# Patient Record
Sex: Female | Born: 1970 | Race: Black or African American | Hispanic: No | Marital: Single | Smoking: Current every day smoker
Health system: Southern US, Community
[De-identification: ages and names within clinical notes are randomized; demographics above are authoritative.]

## PROBLEM LIST (undated history)

## (undated) ENCOUNTER — Ambulatory Visit (HOSPITAL_COMMUNITY): Discharge status: Absent without leave | Payer: 59

## (undated) HISTORY — PX: ECTOPIC PREGNANCY SURGERY: SHX613

---

## 2007-04-12 ENCOUNTER — Ambulatory Visit (HOSPITAL_COMMUNITY): Admission: RE | Admit: 2007-04-12 | Discharge: 2007-04-12 | Payer: Self-pay | Admitting: Family Medicine

## 2007-11-06 ENCOUNTER — Encounter: Admission: RE | Admit: 2007-11-06 | Discharge: 2007-11-06 | Payer: Self-pay | Admitting: Family Medicine

## 2008-11-13 ENCOUNTER — Emergency Department (HOSPITAL_COMMUNITY): Admission: EM | Admit: 2008-11-13 | Discharge: 2008-11-13 | Payer: Self-pay | Admitting: Emergency Medicine

## 2010-11-26 ENCOUNTER — Encounter: Payer: Self-pay | Admitting: Family Medicine

## 2010-11-27 ENCOUNTER — Encounter: Payer: Self-pay | Admitting: Family Medicine

## 2012-07-29 ENCOUNTER — Emergency Department (HOSPITAL_COMMUNITY)
Admission: EM | Admit: 2012-07-29 | Discharge: 2012-07-29 | Disposition: A | Discharge status: Home / Self Care | Payer: 59 | Attending: Emergency Medicine | Admitting: Emergency Medicine

## 2012-07-29 ENCOUNTER — Encounter (HOSPITAL_COMMUNITY): Payer: Self-pay | Admitting: *Deleted

## 2012-07-29 DIAGNOSIS — R21 Rash and other nonspecific skin eruption: Secondary | ICD-10-CM | POA: Insufficient documentation

## 2012-07-29 DIAGNOSIS — F17200 Nicotine dependence, unspecified, uncomplicated: Secondary | ICD-10-CM | POA: Insufficient documentation

## 2012-07-29 MED ORDER — PERMETHRIN 5 % EX CREA: TOPICAL_CREAM | CUTANEOUS | Status: DC

## 2012-07-29 NOTE — ED Notes (Addendum)
Rash for years, gets better , then comes back, pt says atopic dermatitis.    Itches and burns

## 2012-08-01 NOTE — ED Provider Notes (Signed)
History     CSN: [id number]  Arrival date & time 07/29/12  1127   First MD Initiated Contact with Patient 07/29/12 1226      Chief Complaint  Patient presents with  . Rash    (Consider location/radiation/quality/duration/timing/severity/associated sxs/prior treatment) HPI Comments: Tammy Valdez presents with a rash which has been intermittent for the past several years. The rash is itchy and has been seen by a dermatologist and had complete allergy testing which was inconclusive.  She has had multiple treatments with steroid creams which sometimes alleviates the rash and itching.  However, the past several months she has developed a papular rash on her abdomen,  Neck and groin which has not responded to steroid creams.  She reports several coworkers have a similar rash and one was treated for scabies and hers improved.  She is desirous of scabies treatment today.  She denies fevers and chills,  Has used benadryl which sometimes relieves the itch.    The history is provided by the patient.    History reviewed. No pertinent past medical history.  Past Surgical History  Procedure Date  . Ectopic pregnancy surgery     History reviewed. No pertinent family history.  History  Substance Use Topics  . Smoking status: Current Every Day Smoker  . Smokeless tobacco: Not on file  . Alcohol Use: No    OB History    Grav Para Term Preterm Abortions TAB SAB Ect Mult Living                  Review of Systems  Constitutional: Negative for fever and chills.  HENT: Negative for facial swelling.   Respiratory: Negative for shortness of breath and wheezing.   Skin: Positive for rash.  Neurological: Negative for numbness.    Allergies  Review of patient's allergies indicates no known allergies.  Home Medications   Current Outpatient Rx  Name Route Sig Dispense Refill  . PERMETHRIN 5 % EX CREA  Apply from chin down and leave on for 12 hours,  Then shower 60 g 1    BP 127/84   Pulse 74  Temp 98.2 F (36.8 C) (Oral)  Resp 16  SpO2 100%  LMP 07/23/2012  Physical Exam  Constitutional: She appears well-developed and well-nourished. No distress.  HENT:  Head: Normocephalic.  Neck: Neck supple.  Cardiovascular: Normal rate.   Pulmonary/Chest: Effort normal. She has no wheezes.  Musculoskeletal: Normal range of motion. She exhibits no edema.  Skin: Rash noted. Rash is papular.       Scattered papules and excoriations on abdomen and volar forearms.  No drainage, vesicles,  Papules are blanching.  She has a more plaquelike, hyperkeratotic lesion on her right neck more consistent with probable eczematous rash.      ED Course  Procedures (including critical care time)  Labs Reviewed - No data to display No results found.   1. Rash       MDM  Acute on chronic rash with recent exposure to scabies.  Pt was prescribed permethrin with 1 refill.  Instructions given.  Encouraged to f/u with Dr. Margo Aye of dermatology (has seen in past) if sx not improved.        Burgess Amor, PA 08/01/12 2240

## 2012-08-02 NOTE — ED Provider Notes (Signed)
Medical screening examination/treatment/procedure(s) were performed by non-physician practitioner and as supervising physician I was immediately available for consultation/collaboration.   Tatayana Beshears L Excell Neyland, MD 08/02/12 1452 

## 2016-04-04 ENCOUNTER — Other Ambulatory Visit (HOSPITAL_COMMUNITY)
Admission: RE | Admit: 2016-04-04 | Discharge: 2016-04-04 | Disposition: A | Discharge status: Home / Self Care | Payer: BLUE CROSS/BLUE SHIELD | Source: Ambulatory Visit | Attending: Nurse Practitioner | Admitting: Nurse Practitioner

## 2016-04-04 ENCOUNTER — Other Ambulatory Visit: Payer: Self-pay | Admitting: Nurse Practitioner

## 2016-04-04 DIAGNOSIS — Z1151 Encounter for screening for human papillomavirus (HPV): Secondary | ICD-10-CM | POA: Insufficient documentation

## 2016-04-04 DIAGNOSIS — Z01419 Encounter for gynecological examination (general) (routine) without abnormal findings: Secondary | ICD-10-CM | POA: Diagnosis present

## 2016-04-06 LAB — CYTOLOGY - PAP

## 2017-03-20 ENCOUNTER — Ambulatory Visit (HOSPITAL_COMMUNITY)
Admission: EM | Admit: 2017-03-20 | Discharge: 2017-03-20 | Disposition: A | Discharge status: Home / Self Care | Payer: 59 | Attending: Internal Medicine | Admitting: Internal Medicine

## 2017-03-20 ENCOUNTER — Encounter (HOSPITAL_COMMUNITY): Payer: Self-pay | Admitting: Emergency Medicine

## 2017-03-20 DIAGNOSIS — G43909 Migraine, unspecified, not intractable, without status migrainosus: Secondary | ICD-10-CM

## 2017-03-20 MED ORDER — DEXAMETHASONE SODIUM PHOSPHATE 10 MG/ML IJ SOLN
INTRAMUSCULAR | Status: AC
  Filled 2017-03-20: qty 1

## 2017-03-20 MED ORDER — ONDANSETRON 4 MG PO TBDP
4.0000 mg | ORAL_TABLET | Freq: Once | ORAL | Status: AC
  Administered 2017-03-20: 4 mg via ORAL

## 2017-03-20 MED ORDER — ONDANSETRON 4 MG PO TBDP: 4.0000 mg | ORAL_TABLET | Freq: Three times a day (TID) | ORAL | 0 refills | Status: AC | PRN

## 2017-03-20 MED ORDER — DEXAMETHASONE SODIUM PHOSPHATE 10 MG/ML IJ SOLN
10.0000 mg | Freq: Once | INTRAMUSCULAR | Status: AC
  Administered 2017-03-20: 10 mg via INTRAMUSCULAR

## 2017-03-20 MED ORDER — KETOROLAC TROMETHAMINE 10 MG PO TABS: 10.0000 mg | ORAL_TABLET | Freq: Four times a day (QID) | ORAL | 0 refills | Status: AC | PRN

## 2017-03-20 MED ORDER — KETOROLAC TROMETHAMINE 30 MG/ML IJ SOLN
30.0000 mg | Freq: Once | INTRAMUSCULAR | Status: AC
  Administered 2017-03-20: 30 mg via INTRAMUSCULAR

## 2017-03-20 MED ORDER — ONDANSETRON 4 MG PO TBDP
ORAL_TABLET | ORAL | Status: AC
  Filled 2017-03-20: qty 1

## 2017-03-20 MED ORDER — KETOROLAC TROMETHAMINE 30 MG/ML IJ SOLN
INTRAMUSCULAR | Status: AC
  Filled 2017-03-20: qty 1

## 2017-03-20 NOTE — Discharge Instructions (Signed)
For your headache, prescribed Toradol, and Zofran, uses drugs as directed. I recommend he follow-up with community health and wellness to establish for primary care, as if her headaches continue, or worsen, you may need further testing and evaluation.

## 2017-03-20 NOTE — ED Provider Notes (Signed)
CSN: [id number]     Arrival date & time 03/20/17  1815 History   First MD Initiated Contact with Patient 03/20/17 1844     Chief Complaint  Patient presents with  . Facial Pain   (Consider location/radiation/quality/duration/timing/severity/associated sxs/prior Treatment) 46 year old female presents to clinic with three-day history of headache. States she has light sensitivity, sound sensitivity, nausea, headache is described as dull, aching, and global over her entire head. She does have a history of headaches, she does not have or rales, has no vision loss, or change in vision acuity. She denies any unilateral weakness, difficulty with speech, or other symptoms.   The history is provided by the patient.    History reviewed. No pertinent past medical history. Past Surgical History:  Procedure Laterality Date  . ECTOPIC PREGNANCY SURGERY     History reviewed. No pertinent family history. Social History  Substance Use Topics  . Smoking status: Current Every Day Smoker  . Smokeless tobacco: Not on file  . Alcohol use No   OB History    No data available     Review of Systems  Constitutional: Negative.   HENT: Negative.   Eyes: Positive for photophobia.  Respiratory: Negative.   Cardiovascular: Negative.   Gastrointestinal: Negative.   Musculoskeletal: Negative.   Skin: Negative.   Neurological: Positive for headaches. Negative for dizziness, facial asymmetry and numbness.    Allergies  Patient has no known allergies.  Home Medications   Prior to Admission medications   Medication Sig Start Date End Date Taking? Authorizing Provider  ketorolac (TORADOL) 10 MG tablet Take 1 tablet (10 mg total) by mouth every 6 (six) hours as needed. 03/20/17   Dorena BodoKennard, Alecxis Baltzell, NP  ondansetron (ZOFRAN ODT) 4 MG disintegrating tablet Take 1 tablet (4 mg total) by mouth every 8 (eight) hours as needed for nausea or vomiting. 03/20/17   Dorena BodoKennard, Jaskiran Pata, NP   Meds Ordered and  Administered this Visit   Medications  ketorolac (TORADOL) 30 MG/ML injection 30 mg (30 mg Intramuscular Given 03/20/17 1910)  dexamethasone (DECADRON) injection 10 mg (10 mg Intramuscular Given 03/20/17 1909)  ondansetron (ZOFRAN-ODT) disintegrating tablet 4 mg (4 mg Oral Given 03/20/17 1908)    BP (!) 155/82 (BP Location: Right Arm)   Pulse 81   Temp 98.5 F (36.9 C) (Oral)   Resp 18   LMP 07/23/2012   SpO2 100%  No data found.   Physical Exam  Constitutional: She is oriented to person, place, and time. She appears well-developed and well-nourished. No distress.  HENT:  Head: Normocephalic and atraumatic.  Right Ear: External ear normal.  Left Ear: External ear normal.  Nose: Nose normal.  Mouth/Throat: Oropharynx is clear and moist.  Eyes: Conjunctivae and EOM are normal. Pupils are equal, round, and reactive to light. Right eye exhibits no discharge. Left eye exhibits no discharge.  Neck: Normal range of motion. Neck supple.  Cardiovascular: Normal rate and regular rhythm.   Pulmonary/Chest: Effort normal and breath sounds normal.  Abdominal: Soft. Bowel sounds are normal.  Lymphadenopathy:    She has no cervical adenopathy.  Neurological: She is alert and oriented to person, place, and time. No cranial nerve deficit. She exhibits normal muscle tone. Coordination normal.  Skin: Skin is warm and dry. Capillary refill takes less than 2 seconds. She is not diaphoretic.  Psychiatric: She has a normal mood and affect. Her behavior is normal.  Nursing note and vitals reviewed.   Urgent Care Course     Procedures (  including critical care time)  Labs Review Labs Reviewed - No data to display  Imaging Review No results found.      MDM   1. Migraine without status migrainosus, not intractable, unspecified migraine type    Treating for migraine, patient given Toradol, and Decadron in clinic. Along with Zofran. Discharged home with a work note, prescription for Zofran,  and for Toradol as well. Provided contact information for community health and wellness, encouraged her to follow up to establish for primary care, follow-up guidelines were given should her headache worsen, go to the ER.    Dorena Bodo, NP 03/20/17 2047

## 2017-03-20 NOTE — ED Triage Notes (Signed)
[facility] a [facility] of [facility] headache and sinus pain and pressure x 2 days.

## 2021-05-14 DIAGNOSIS — F17200 Nicotine dependence, unspecified, uncomplicated: Secondary | ICD-10-CM | POA: Diagnosis not present

## 2021-05-14 DIAGNOSIS — J0100 Acute maxillary sinusitis, unspecified: Secondary | ICD-10-CM | POA: Diagnosis not present

## 2021-05-14 DIAGNOSIS — I10 Essential (primary) hypertension: Secondary | ICD-10-CM | POA: Diagnosis not present

## 2021-06-03 ENCOUNTER — Other Ambulatory Visit: Payer: Self-pay | Admitting: Nurse Practitioner

## 2021-06-03 DIAGNOSIS — S30814A Abrasion of vagina and vulva, initial encounter: Secondary | ICD-10-CM | POA: Diagnosis not present

## 2021-06-03 DIAGNOSIS — N6489 Other specified disorders of breast: Secondary | ICD-10-CM

## 2021-06-03 DIAGNOSIS — Z01419 Encounter for gynecological examination (general) (routine) without abnormal findings: Secondary | ICD-10-CM | POA: Diagnosis not present

## 2021-06-03 DIAGNOSIS — I10 Essential (primary) hypertension: Secondary | ICD-10-CM | POA: Diagnosis not present

## 2021-06-03 DIAGNOSIS — L292 Pruritus vulvae: Secondary | ICD-10-CM | POA: Diagnosis not present

## 2021-06-03 DIAGNOSIS — Z113 Encounter for screening for infections with a predominantly sexual mode of transmission: Secondary | ICD-10-CM | POA: Diagnosis not present

## 2021-06-11 DIAGNOSIS — Z20822 Contact with and (suspected) exposure to covid-19: Secondary | ICD-10-CM | POA: Diagnosis not present

## 2021-07-06 DIAGNOSIS — I10 Essential (primary) hypertension: Secondary | ICD-10-CM | POA: Diagnosis not present

## 2021-07-06 DIAGNOSIS — Z23 Encounter for immunization: Secondary | ICD-10-CM | POA: Diagnosis not present

## 2021-07-06 DIAGNOSIS — Z0000 Encounter for general adult medical examination without abnormal findings: Secondary | ICD-10-CM | POA: Diagnosis not present

## 2021-07-06 DIAGNOSIS — F17200 Nicotine dependence, unspecified, uncomplicated: Secondary | ICD-10-CM | POA: Diagnosis not present

## 2021-07-06 DIAGNOSIS — Z13220 Encounter for screening for lipoid disorders: Secondary | ICD-10-CM | POA: Diagnosis not present

## 2021-07-07 ENCOUNTER — Ambulatory Visit
Admission: RE | Admit: 2021-07-07 | Discharge: 2021-07-07 | Disposition: A | Discharge status: Home / Self Care | Payer: BC Managed Care – PPO | Source: Ambulatory Visit | Attending: Nurse Practitioner | Admitting: Nurse Practitioner

## 2021-07-07 ENCOUNTER — Ambulatory Visit: Admission: RE | Admit: 2021-07-07 | Payer: 59 | Source: Ambulatory Visit

## 2021-07-07 ENCOUNTER — Ambulatory Visit: Payer: 59

## 2021-07-07 ENCOUNTER — Other Ambulatory Visit: Payer: Self-pay

## 2021-07-07 DIAGNOSIS — N6489 Other specified disorders of breast: Secondary | ICD-10-CM

## 2021-07-07 DIAGNOSIS — R922 Inconclusive mammogram: Secondary | ICD-10-CM | POA: Diagnosis not present

## 2021-10-03 DIAGNOSIS — I10 Essential (primary) hypertension: Secondary | ICD-10-CM | POA: Diagnosis not present

## 2022-05-01 DIAGNOSIS — Z1211 Encounter for screening for malignant neoplasm of colon: Secondary | ICD-10-CM | POA: Diagnosis not present

## 2022-05-01 DIAGNOSIS — D122 Benign neoplasm of ascending colon: Secondary | ICD-10-CM | POA: Diagnosis not present

## 2022-05-01 DIAGNOSIS — D124 Benign neoplasm of descending colon: Secondary | ICD-10-CM | POA: Diagnosis not present

## 2022-05-01 DIAGNOSIS — K648 Other hemorrhoids: Secondary | ICD-10-CM | POA: Diagnosis not present

## 2022-05-01 DIAGNOSIS — D125 Benign neoplasm of sigmoid colon: Secondary | ICD-10-CM | POA: Diagnosis not present

## 2022-06-05 DIAGNOSIS — Z01419 Encounter for gynecological examination (general) (routine) without abnormal findings: Secondary | ICD-10-CM | POA: Diagnosis not present

## 2022-06-05 DIAGNOSIS — Z113 Encounter for screening for infections with a predominantly sexual mode of transmission: Secondary | ICD-10-CM | POA: Diagnosis not present

## 2022-08-29 IMAGING — MG DIGITAL DIAGNOSTIC BILAT W/ TOMO W/ CAD
8 series · 8 of 24 positions shown · non-contrast
Comparison: Previous exam(s).

CLINICAL DATA: Family history of breast cancer, including the
patient's mother.

EXAM:
DIGITAL DIAGNOSTIC BILATERAL MAMMOGRAM WITH TOMOSYNTHESIS AND CAD
TECHNIQUE: Bilateral digital diagnostic mammography and breast tomosynthesis
was performed. The images were evaluated with computer-aided
detection.

[R CC synth-2D]
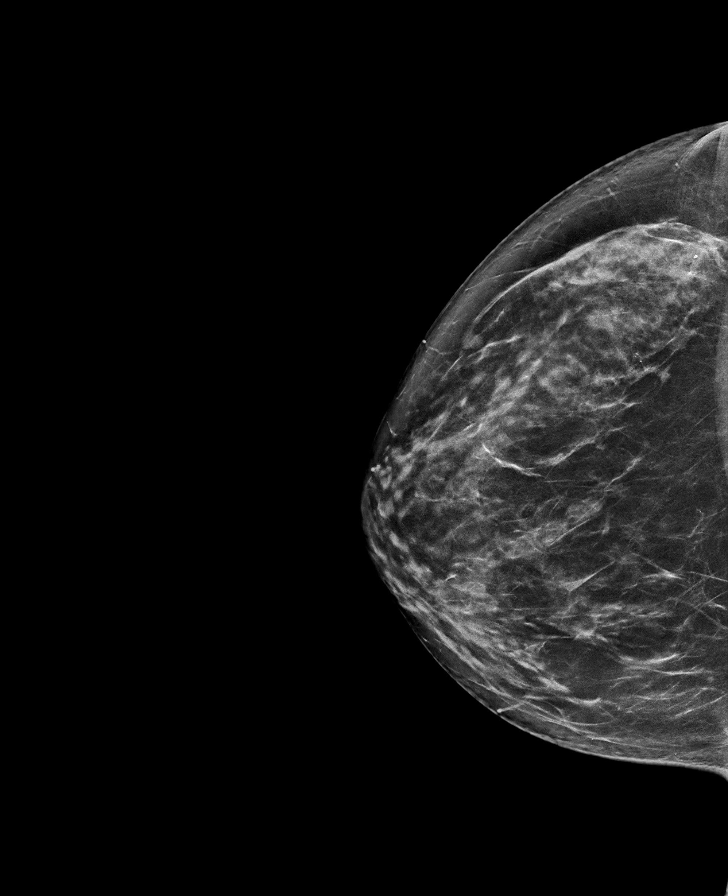

[L MLO synth-2D]
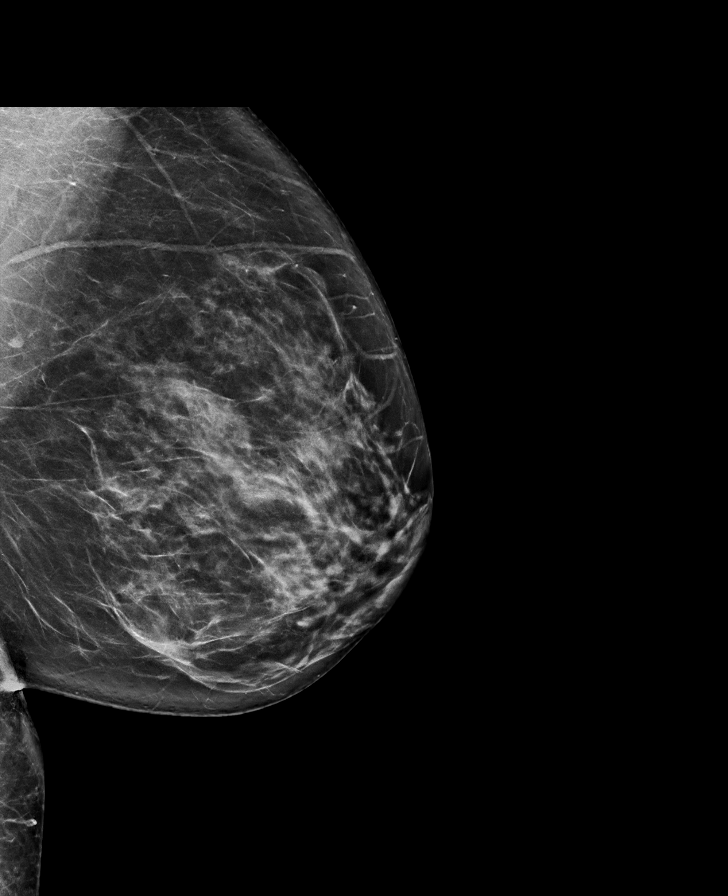

[R MLO synth-2D]
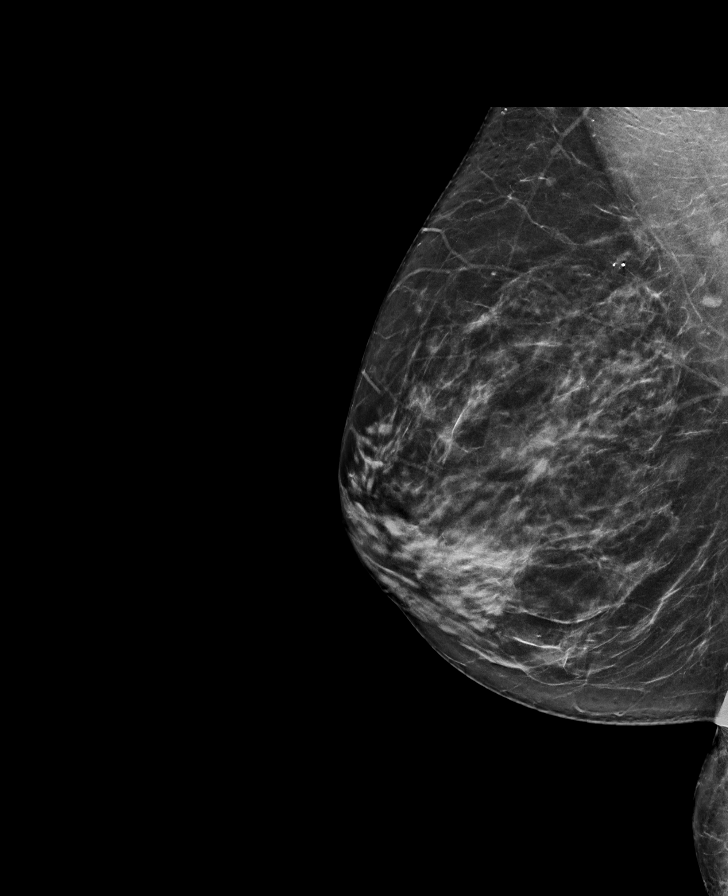

[L CC synth-2D]
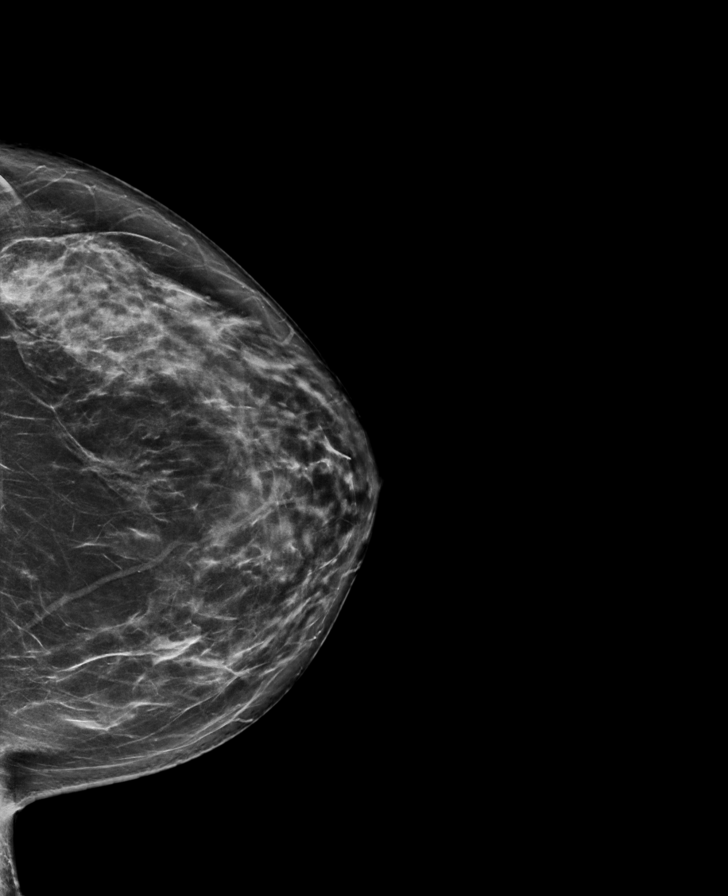

[L MLO tomo · tomo slice 41/80.0]
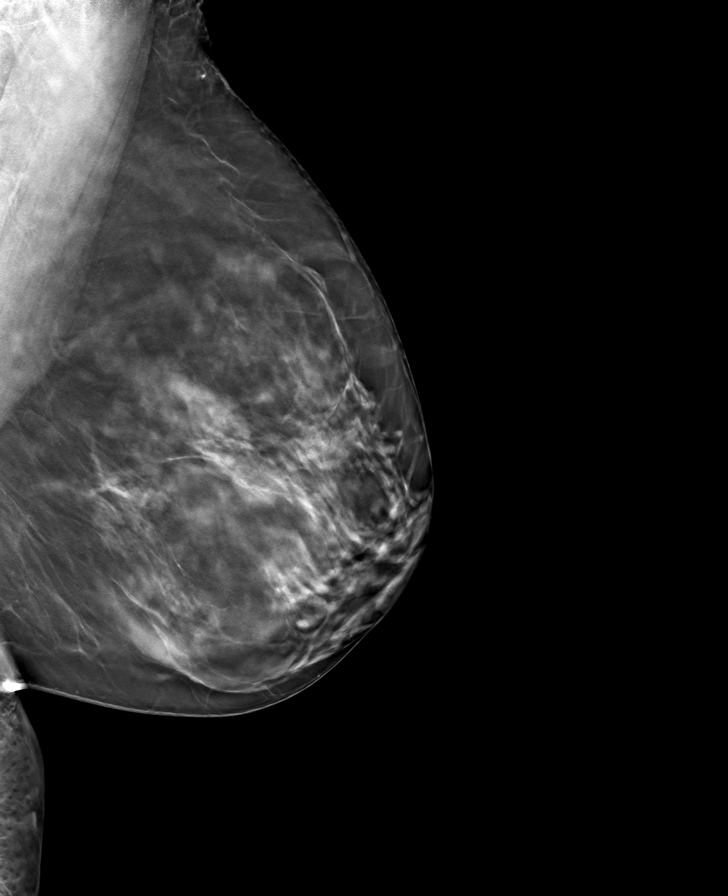

[R MLO tomo · tomo slice 38/75.0]
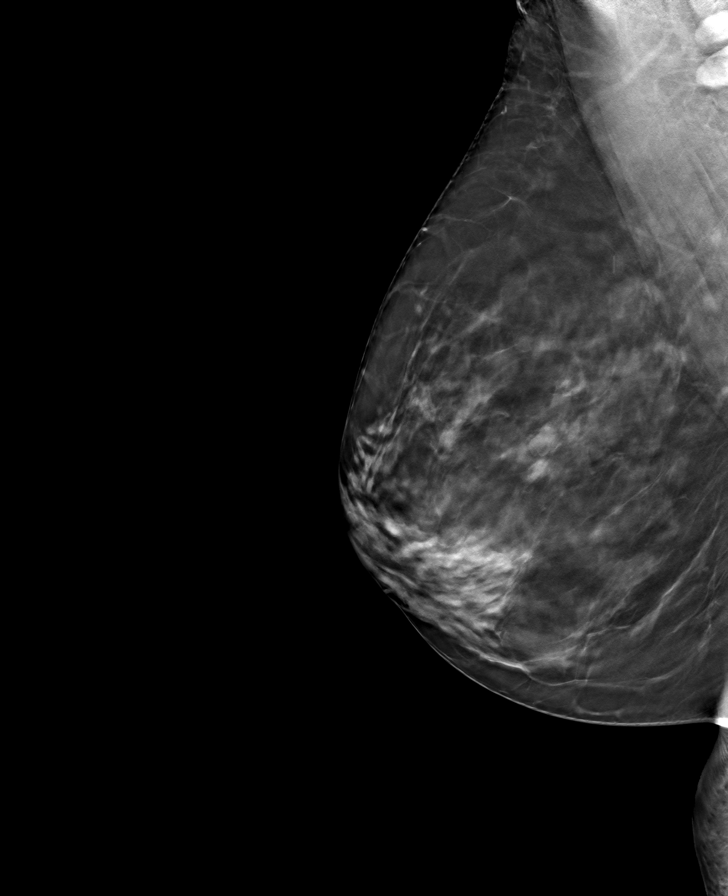

[R CC tomo · tomo slice 41/80.0]
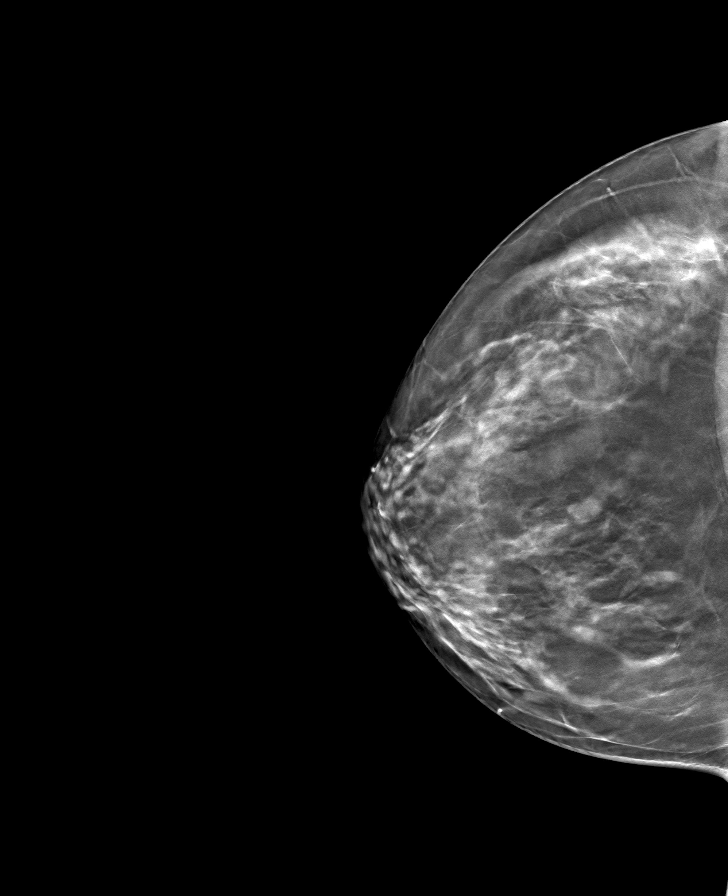

[L CC tomo · tomo slice 40/79.0]
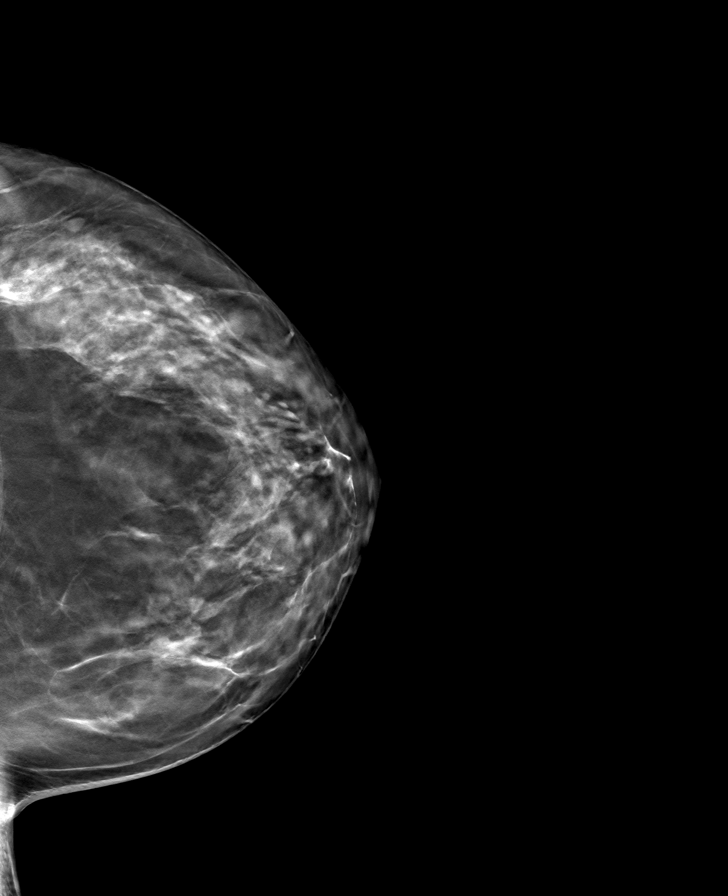

[8 of 24 positions shown; findings below may reference images not displayed]

ACR Breast Density Category c: The breast tissue is heterogeneously
dense, which may obscure small masses.
FINDINGS: No findings suspicious for malignancy in either breast.
IMPRESSION: No evidence of malignancy.

RECOMMENDATION:
Bilateral screening mammogram in 1 year.

I have discussed the findings and recommendations with the patient.
If applicable, a reminder letter will be sent to the patient
regarding the next appointment.

BI-RADS CATEGORY  1: Negative.

## 2022-10-11 DIAGNOSIS — E7800 Pure hypercholesterolemia, unspecified: Secondary | ICD-10-CM | POA: Diagnosis not present

## 2022-10-11 DIAGNOSIS — Z23 Encounter for immunization: Secondary | ICD-10-CM | POA: Diagnosis not present

## 2022-10-11 DIAGNOSIS — F17200 Nicotine dependence, unspecified, uncomplicated: Secondary | ICD-10-CM | POA: Diagnosis not present

## 2022-10-11 DIAGNOSIS — I10 Essential (primary) hypertension: Secondary | ICD-10-CM | POA: Diagnosis not present

## 2022-10-11 DIAGNOSIS — Z0000 Encounter for general adult medical examination without abnormal findings: Secondary | ICD-10-CM | POA: Diagnosis not present

## 2023-01-10 DIAGNOSIS — F17200 Nicotine dependence, unspecified, uncomplicated: Secondary | ICD-10-CM | POA: Diagnosis not present

## 2023-01-10 DIAGNOSIS — E7800 Pure hypercholesterolemia, unspecified: Secondary | ICD-10-CM | POA: Diagnosis not present

## 2023-01-10 DIAGNOSIS — Z23 Encounter for immunization: Secondary | ICD-10-CM | POA: Diagnosis not present

## 2023-06-11 DIAGNOSIS — L304 Erythema intertrigo: Secondary | ICD-10-CM | POA: Diagnosis not present

## 2023-06-11 DIAGNOSIS — Z7251 High risk heterosexual behavior: Secondary | ICD-10-CM | POA: Diagnosis not present

## 2023-06-11 DIAGNOSIS — Z01419 Encounter for gynecological examination (general) (routine) without abnormal findings: Secondary | ICD-10-CM | POA: Diagnosis not present

## 2023-08-27 ENCOUNTER — Other Ambulatory Visit: Payer: Self-pay | Admitting: Physician Assistant

## 2023-08-27 DIAGNOSIS — Z1231 Encounter for screening mammogram for malignant neoplasm of breast: Secondary | ICD-10-CM

## 2023-09-06 ENCOUNTER — Ambulatory Visit
Admission: RE | Admit: 2023-09-06 | Discharge: 2023-09-06 | Disposition: A | Discharge status: Home / Self Care | Payer: BC Managed Care – PPO | Source: Ambulatory Visit | Attending: Physician Assistant | Admitting: Physician Assistant

## 2023-09-06 DIAGNOSIS — Z1231 Encounter for screening mammogram for malignant neoplasm of breast: Secondary | ICD-10-CM | POA: Diagnosis not present

## 2023-09-18 DIAGNOSIS — K047 Periapical abscess without sinus: Secondary | ICD-10-CM | POA: Diagnosis not present

## 2023-10-16 ENCOUNTER — Other Ambulatory Visit: Payer: Self-pay | Admitting: Physician Assistant

## 2023-10-16 DIAGNOSIS — Z0000 Encounter for general adult medical examination without abnormal findings: Secondary | ICD-10-CM | POA: Diagnosis not present

## 2023-10-16 DIAGNOSIS — F17210 Nicotine dependence, cigarettes, uncomplicated: Secondary | ICD-10-CM | POA: Diagnosis not present

## 2023-10-16 DIAGNOSIS — E2839 Other primary ovarian failure: Secondary | ICD-10-CM

## 2023-10-16 DIAGNOSIS — E7800 Pure hypercholesterolemia, unspecified: Secondary | ICD-10-CM | POA: Diagnosis not present

## 2023-10-16 DIAGNOSIS — I10 Essential (primary) hypertension: Secondary | ICD-10-CM | POA: Diagnosis not present

## 2024-04-09 DIAGNOSIS — E7800 Pure hypercholesterolemia, unspecified: Secondary | ICD-10-CM | POA: Diagnosis not present

## 2024-04-09 DIAGNOSIS — I10 Essential (primary) hypertension: Secondary | ICD-10-CM | POA: Diagnosis not present

## 2024-04-09 DIAGNOSIS — F17210 Nicotine dependence, cigarettes, uncomplicated: Secondary | ICD-10-CM | POA: Diagnosis not present

## 2024-06-24 DIAGNOSIS — L280 Lichen simplex chronicus: Secondary | ICD-10-CM | POA: Diagnosis not present

## 2024-06-24 DIAGNOSIS — Z01419 Encounter for gynecological examination (general) (routine) without abnormal findings: Secondary | ICD-10-CM | POA: Diagnosis not present

## 2024-08-06 DIAGNOSIS — L280 Lichen simplex chronicus: Secondary | ICD-10-CM | POA: Diagnosis not present

## 2024-09-26 ENCOUNTER — Other Ambulatory Visit: Payer: Self-pay | Admitting: Nurse Practitioner

## 2024-09-26 DIAGNOSIS — Z1231 Encounter for screening mammogram for malignant neoplasm of breast: Secondary | ICD-10-CM

## 2024-10-22 DIAGNOSIS — I10 Essential (primary) hypertension: Secondary | ICD-10-CM | POA: Diagnosis not present

## 2024-10-22 DIAGNOSIS — E7800 Pure hypercholesterolemia, unspecified: Secondary | ICD-10-CM | POA: Diagnosis not present

## 2024-10-22 DIAGNOSIS — Z0000 Encounter for general adult medical examination without abnormal findings: Secondary | ICD-10-CM | POA: Diagnosis not present

## 2024-10-22 DIAGNOSIS — F17200 Nicotine dependence, unspecified, uncomplicated: Secondary | ICD-10-CM | POA: Diagnosis not present

## 2024-10-24 ENCOUNTER — Inpatient Hospital Stay
Admission: RE | Admit: 2024-10-24 | Discharge: 2024-10-24 | Attending: Nurse Practitioner | Admitting: Nurse Practitioner

## 2024-10-24 DIAGNOSIS — Z1231 Encounter for screening mammogram for malignant neoplasm of breast: Secondary | ICD-10-CM | POA: Diagnosis not present
